# Patient Record
Sex: Male | Born: 1990 | Race: White | Hispanic: No | Marital: Single | Smoking: Current some day smoker
Health system: Southern US, Community
[De-identification: ages and names within clinical notes are randomized; demographics above are authoritative.]

## PROBLEM LIST (undated history)

## (undated) DIAGNOSIS — R1310 Dysphagia, unspecified: Secondary | ICD-10-CM

## (undated) DIAGNOSIS — S6990XA Unspecified injury of unspecified wrist, hand and finger(s), initial encounter: Secondary | ICD-10-CM

## (undated) DIAGNOSIS — M21379 Foot drop, unspecified foot: Secondary | ICD-10-CM

## (undated) DIAGNOSIS — R21 Rash and other nonspecific skin eruption: Secondary | ICD-10-CM

## (undated) DIAGNOSIS — R198 Other specified symptoms and signs involving the digestive system and abdomen: Secondary | ICD-10-CM

## (undated) HISTORY — PX: ORIF CLAVICLE FRACTURE: SUR924

---
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
## 2008-01-04 ENCOUNTER — Ambulatory Visit (HOSPITAL_BASED_OUTPATIENT_CLINIC_OR_DEPARTMENT_OTHER): Admission: RE | Admit: 2008-01-04 | Discharge: 2008-01-04 | Payer: Self-pay | Admitting: Orthopedic Surgery

## 2008-01-04 HISTORY — PX: PERCUTANEOUS PINNING METACARPAL FRACTURE: SUR206

## 2008-04-13 ENCOUNTER — Ambulatory Visit (HOSPITAL_BASED_OUTPATIENT_CLINIC_OR_DEPARTMENT_OTHER): Admission: RE | Admit: 2008-04-13 | Discharge: 2008-04-13 | Payer: Self-pay | Admitting: Orthopedic Surgery

## 2008-04-13 HISTORY — PX: HAND HARDWARE REMOVAL: SUR1125

## 2009-01-23 ENCOUNTER — Emergency Department (HOSPITAL_COMMUNITY): Admission: EM | Admit: 2009-01-23 | Discharge: 2009-01-24 | Payer: Self-pay | Admitting: Emergency Medicine

## 2011-01-14 NOTE — Op Note (Signed)
Louis [name], Louis [name]             ACCOUNT NO.:  [id number]   MEDICAL RECORD NO.:  [id number]          PATIENT TYPE:  AMB   LOCATION:  DSC                          FACILITY:  [facility]   PHYSICIAN:  Cindee Salt, M.D.       DATE OF BIRTH:  Oct 13, 1990   DATE OF PROCEDURE:  01/04/2008  DATE OF DISCHARGE:                               OPERATIVE REPORT   PREOPERATIVE DIAGNOSIS:  Fractured fifth metacarpal, right hand.   POSTOPERATIVE DIAGNOSIS:  Fractured fifth metacarpal, right hand.   OPERATION:  Closed IM rodding of fifth metacarpal, right hand.   SURGEON:  Cindee Salt, MD   ASSISTANT:  Carolyne Fiscal   ANESTHESIA:  General.   ANESTHESIOLOGIST:  Guadalupe Maple, MD   FIXATION DEVICE:  [employer] IM rod.   HISTORY:  The patient is an 20 year old male who broke up with his  girlfriend, punched a wall, suffering a fracture of his fifth  metacarpal.  His mid shaft grossly angulated with comminution.  He is  admitted for close rodding possible open reduction internal fixation,  fifth metacarpal right hand.  He is aware of risks and complications  including infection, recurrence, injury to arteries, nerves, tendons  incomplete relief of symptoms, dystrophy, possibility of loss of  fixation, and bending of the rod necessitating plate fixation.   In the preoperative area, the patient is seen.  The extremity is marked  by both the patient and surgeon.  Antibiotic given.   PROCEDURE:  The patient is brought to the operating room where general  anesthetic was carried out without difficulty under the guidance of Dr.  Noreene Larsson.  He was Prepped using DuraPrep, supine position, right arm free.  The image intensifier was brought into position.  The fracture was  manipulated reduced.  This was held so long as pressure was maintained.  With any reduction of pressure, the fracture medially re-displaced.  Comminution on the volar surface was noted.  A small incision was made  proximally after  localization with the image intensifier carried down  through subcutaneous tissue.  Dorsal sensory branch of the ulnar nerve  protected.  The dissection carried between the extensor digiti quinti,  extensor carpi ulnaris.  The trocar was used to make a hole in the  proximal cortex of the fifth metacarpal.  The IM rod was then inserted.  This was then slowly manipulated up the metacarpal under image  intensification crossing the fracture site maintaining a reduced  position with full flexion of all fingers and volar pressure to maintain  the distal fragment dorsally positioned.  The IM rod was then inserted  all the way up to the metacarpal head.  X-rays confirmed reduction of  the fracture in both AP lateral and oblique directions.  The pin was  then bent, cut short beneath the skin.  X-rays confirmed maintenance of  the position of the fracture fragments.  The skin was closed with  interrupted 5-0 Vicryl Rapide sutures.  Sterile compressive dressing and  ulnar gutter splint  was applied.  The patient tolerated the procedure well and was taken to  the recovery room  for observation in satisfactory condition.  He will be  discharged home, and to return to the [facility] in [place] in 1-  week time, on Vicodin.           ______________________________  Cindee Salt, M.D.     GK/MEDQ  D:  01/04/2008  T:  01/05/2008  Job:  [id number]

## 2011-01-14 NOTE — Op Note (Signed)
NAMEAMARRION, [name]             ACCOUNT NO.:  [id number]   MEDICAL RECORD NO.:  [id number]          PATIENT TYPE:  AMB   LOCATION:  DSC                          FACILITY:  [facility]   PHYSICIAN:  Cindee Salt, M.D.       DATE OF BIRTH:  1991/07/02   DATE OF PROCEDURE:  04/13/2008  DATE OF DISCHARGE:                               OPERATIVE REPORT   PREOPERATIVE DIAGNOSIS:  Status post IM rod fixation fifth metacarpal  fracture, right hand.   POSTOPERATIVE DIAGNOSIS:  Status post IM rod fixation fifth metacarpal  fracture, right hand.   OPERATION:  Removal IM rod fifth metacarpal right hand.   SURGEON:  Cindee Salt, MD   SUMMARY:  Louis Courts, RN   ANESTHESIA:  General.   ANESTHESIOLOGIST:  Maren Beach, MD   HISTORY:  The patient is an 20 year old male who suffered a fracture of  his midshaft fifth metacarpal.  He underwent IM rod fixation.  This has  gone on to union.  He is admitted now for removal of the IM rod and that  it is prominent proximally.  He is aware of risks and complications  including infection, recurrence, injury to arteries, nerves, or tendons,  incomplete relief of symptoms, or dystrophy.  In the preoperative area,  the patient is seen.  The extremity marked by both the patient and  surgeon.  Antibiotic given.   PROCEDURE:  The patient was brought to the operating room where a  general anesthetic was carried out under the direction of Dr. Katrinka Blazing.  He  was prepped using DuraPrep, supine position, right arm free.  The limb  was exsanguinated with an Esmarch bandage after a time-out.  The  tourniquet was placed high and the arm was inflated to 250 mmHg.  A  straight incision was made directly over the tip of the IM rod, carried  down through the subcutaneous tissue.  Neurovascular structures  protected.  The IM rod was immediately identified.  This was removed  without difficulty with a pair of pliers.  The wound was irrigated and  closed interrupted 5-0  Vicryl Rapide sutures.  The patient tolerated the  procedure well was taken to the recovery room for observation in  satisfactory condition.  He will be discharged home to return to [facility] [place] in 1 week on Vicodin.           ______________________________  Cindee Salt, M.D.     GK/MEDQ  D:  04/13/2008  T:  04/14/2008  Job:  [id number]

## 2011-01-14 NOTE — Consult Note (Signed)
NAMEZACKERY, BRINE NO.:  [id number]   MEDICAL RECORD NO.:  [id number]          PATIENT TYPE:  INP   LOCATION:  1508                         FACILITY:  [facility]   PHYSICIAN:  Anselm Jungling, MD  DATE OF BIRTH:  31-May-1991   DATE OF CONSULTATION:  11/23/2007  DATE OF DISCHARGE:                                 CONSULTATION   IDENTIFYING DATA AND REASON FOR REFERRAL:  The patient is an 20 year old  single white male currently under the care of the [facility] here  at [facility], in the aftermath of an overdose.  Psychiatric  consultation is requested to assess mental status and make  recommendations.   HISTORY OF THE PRESENTING PROBLEMS:  The patient is 34 years old, and  currently lives with his girlfriend locally.  His parents live in  [place].  He states that he recently tried to moved to  [place], [place], but had to move back, and is now living with his  girlfriend.  He had a severe argument with his girlfriend, which led him  to take an overdose of his antidepressant Lexapro on November 22, 2007.  The patient has subsequently been treated for that and is medically  cleared now.   The patient has a psychiatric history, having been hospitalized at our  inpatient adult psychiatric unit from November 4 to July 12, 2007  under the care of Dr. Milford Cage.  At that time, he was treated for  mood disorder with a combination of Lamictal, Risperdal, Abilify, and  referred to Dr. Tomasa Rand, who remains his outpatient psychiatrist.  He  was also referred to a therapist named Ulice Bold.   The patient at this time indicates that he still intends to follow up  with Dr. Tomasa Rand for ongoing psychiatric medication management.   The patient also has a history of polysubstance abuse, and current urine  drug screen is positive for marijuana and benzodiazepines.   MENTAL STATUS AND OBSERVATIONS:  The patient is a well-nourished,  well-  developed, young adult male who was pleasant and cooperative in the  interview situation.  He is polite, and shakes my hand.  He is fully  alert and oriented x4.  There is nothing to suggest any underlying  formal thought disorder, psychosis, or delusionality.  His mood appears  neutral, and his affect is appropriate.   With respect to the overdose, he states that he has never attempted  suicide before.  He indicates that he regrets his overdose and is glad  for his survival.  He denies any current thoughts, impulses, or plans to  harm himself.  He states that he and his girlfriend have made up, and in  fact, his girlfriend is present in the room, and he wished for her to be  at present while I interviewed him.   He states that his plan is to follow-up with Dr. Tomasa Rand, and return  to live with his girlfriend in her home.  The girlfriend appears to be  completely comfortable with this plan.   IMPRESSION:  The patient is an 20 year old male  with a history of mood  disorder and polysubstance abuse.  At this time, he does not appear to  be appropriate for inpatient psychiatric treatment.  He admits that his  overdose was impulsive and poorly judged, and he regrets it.  He  indicates that he will follow-up with his usual outpatient psychiatric  [facility].   DIAGNOSTIC IMPRESSION:  AXIS I:  Mood disorder, not otherwise specified,  and polysubstance abuse, not otherwise specified.  AXIS II:  Deferred.  AXIS III:  No acute or chronic illnesses.  AXIS IV:  Stressors severe.  AXIS V:  GAF 65.   RECOMMENDATIONS:  As above.   Thank you for involving me in this patient's care.      Anselm Jungling, MD  Electronically Signed     SPB/MEDQ  D:  11/23/2007  T:  11/23/2007  Job:  [id number]

## 2011-01-14 NOTE — Discharge Summary (Signed)
Louis [name], Louis [name]             ACCOUNT NO.:  [id number]   MEDICAL RECORD NO.:  [id number]          PATIENT TYPE:  IPS   LOCATION:  0604                          FACILITY:  [facility]   PHYSICIAN:  Jasmine Pang, M.D. DATE OF BIRTH:  Nov 14, 1990   DATE OF ADMISSION:  07/06/2007  DATE OF DISCHARGE:  07/12/2007                               DISCHARGE SUMMARY   IDENTIFYING INFORMATION:  An 20 year old single white male who was  admitted on the voluntary basis on July 06, 2007.   HISTORY OF PRESENT ILLNESS:  The patient complains of anxiety with  episodes of panic.  He feels overwhelmed with anxiety.  He has been  smoking marijuana several times a day.  He has also been drinking  alcohol to calm his nerves.  He has been abusing multiple substances.  One week ago, took alcohol with several Risperdal and Tylenol.  He wants  to get clean and move on with his life he states.  A prior OD was  intentional.  He endorses suicidal ideation.  He has had the  polysubstance abuse since December 2007.  The patient currently sees Dr.  Tomasa Rand at [place].  This is a first [facility] admission for the  patient.  This is a first inpatient admission.  He has had one suicide  attempt, no abuse, one prior history of cutting self several times, and  he has no medical problems.  He is currently on Abilify 15 mg at bedtime  and Lamictal 50 mg at bedtime.  He has akathisia with Seroquel.   PHYSICAL FINDINGS:  There were no acute physical or medical problems  noted on physical exam.   Urinalysis was within normal limits.  TSH was 2.659.  Comprehensive  metabolic panel was within normal limits.  CBC was within normal limits.  Urine drug screen was positive for marijuana.   HOSPITAL COURSE:  Upon admission, the patient was continued on Abilify  50 mg p.o. at bedtime, was also placed on Ambien 10 mg p.o. at bedtime,  was placed on Lamictal 50 mg p.o. at bedtime, and Benadryl 25 mg p.o.  t.i.d. p.r.n. anxiety.   On July 09, 2007, he was started on Risperdal  0.5 mg p.o. q. 6h. p.r.n. anxiety.  The patient tolerated these  medications well with no significant side effects.  He was friendly and  cooperative in individual sessions with me.  He was also able to  participate appropriately in unit therapeutic groups and activities.  He  discussed his really bad anxiety.  He had been using a lot of over-the-  counter medications like Robitussin and Sudafed.  He has been taking  Adderall that he buys off the street to help focus.  He admits to using  marijuana daily.  He endorses some conflict with his parents due to his  substance abuse.  As hospitalization progressed, the patient's mood  improved.  He at times had fleeting suicidal ideation, but no intent.  There was no homicidal ideation.  Sleep was good.  Appetite was good.  On July 09, 2007, he felt worse being depressed, anxious, and  irritable.  He stated I feel like I want to die but contracted for  safety here.  He is hoping that his girlfriend would visit.  He enjoyed  a visit from his mother and sister the day before.  On July 11, 2007,  mental status had improved.  Sleep was good.  Appetite was good.  His  mood was euthymic.  No suicidal ideation.  His family session was  scheduled for July 12, 2007, and he is going to be going home after  this.  His states his goal is to be closer to his family.  On July 12, 2007, mental status had improved markedly from admission status.  The patient continued to be euthymic.  Affect was wide range.  There was  no suicidal or homicidal ideation.  No thoughts of self-injurious  behavior.  No auditory or visual hallucinations.  No paranoia or  delusions.  Thoughts were logical and goal-directed.  Thought content,  no predominant theme.  Cognitive was grossly back to baseline.  It was  felt the patient was stable and safe to be discharged today.   DISCHARGE DIAGNOSES:  AXIS I:  Mood disorder,  not otherwise specified.  Anxiety disorder, not otherwise specified.  Polysubstance abuse.  AXIS II:  None.  AXIS III:  None.  AXIS IV:  Moderate (issues with unemployment, problems with primary  support group, economic problem, burden of psychiatric illness).  AXIS V:  Global assessment of functioning was 50 upon discharge, global  assessment of functioning was 38 upon admission, and global assessment  of functioning highest past year was 67.   DISCHARGE PLANS:  There were no specific activity level or dietary  restrictions.   POST HOSPITAL CARE PLANS:  The patient will see Dr. Tomasa Rand on  Friday, July 16, 2007, at 10:45.  He will also see Ulice Bold,  his therapist on Wednesday, July 21, 2007, at 10:30 a.m.   DISCHARGE MEDICATIONS:  1. Lamictal 50 mg at bedtime.  2. Ambien 10 mg at bedtime p.r.n.  3. Risperdal 0.5 mg q. 6h. p.r.n. anxiety.  4. Abilify 50 mg at bedtime.      Jasmine Pang, M.D.  Electronically Signed     BHS/MEDQ  D:  07/12/2007  T:  07/13/2007  Job:  [id number]

## 2011-01-14 NOTE — H&P (Signed)
NAMERECARDO, LINN             ACCOUNT NO.:  [id number]   MEDICAL RECORD NO.:  [id number]          PATIENT TYPE:  INP   LOCATION:  0110                         [facility]ACILITY:  [facility]   PHYSICIAN:  Isidor Holts, M.D.  DATE OF BIRTH:  03/06/1991   DATE OF ADMISSION:  11/22/2007  DATE OF DISCHARGE:                              HISTORY & PHYSICAL   PMD:  Unassigned.   PRIMARY PSYCHIATRIST:  Dr. Tomasa Rand.   CHIEF COMPLAINTS:  Lexapro overdose.   HISTORY OF PRESENT ILLNESS:  This is an 20 year old male, who provided  the history, supplemented by his girlfriend who has accompanied him to  the emergency department.  According to them, they had an argument in  a.m. of November 22, 2007 and then after this quarrel, between 8:30 and  9:00 a.m. the patient took six 10-mg tablets of Lexapro.  He states that  he had not seriously intended to harm himself. And he had reportedly  obtained these pills from a friend.  He denies abdominal pain, vomiting,  chest pain, palpitations or shortness of breath.  His girlfriend called  emergency medical services.   PAST MEDICAL HISTORY:  1. Mood disorder/anxiety disorder, status post voluntary admission to      [facility] July 06, 2007 to July 12, 2007.  2. Substance abuse, i.e. marijuana.  3. Smoking history.   MEDICATIONS:  Not currently on any medication.  However, a review of  electronic medical records indicate that the patient was on:   1. Lamictal 15 mg p.o. q.h.s.  2. Ambien 10 mg p.o. p.r.n. q.h.s.  3. Risperdal 0.5 mg p.o. p.r.n. q.6h.  4. Abilify 50 mg p.o. q.h.s.   The patient claims, however, that these medications were lost with his  luggage when he was flying back from a trip to [place] on October 16, 2007. He has not refilled his medications since.   ALLERGIES:  SEROQUEL.   REVIEW OF SYSTEMS:  As per HPI and chief complaint. Otherwise negative.   SOCIAL HISTORY:  The patient currently lives with his  girlfriend. He is  unemployed and is a smoker.  He smokes approximately 5-6 cigarettes per  day but now states that he only takes one cigarette on alternate days.  He admits to occasional marijuana use.  Denies drug or other substance  abuse. States that he drinks alcohol usually only on weekends.  According to the patient he previously resided in [place] but left  for [place] on September 07, 2007, he thought,  permanently. However, he had  to come back to [place] on October 16, 2007.  And as such has no  scheduled appointment with his psychiatrist.  He has not yet called to  make the appointment.   FAMILY HISTORY:  The patient has three sisters, all of whom are alive  and well.  His mother is age 64 years old, alive and well.  His father  is age 83 years, alive and well.   PHYSICAL EXAMINATION:  VITALS:  Temperature 98.4, pulse 92 per minute  regular, respiratory rate 18, BP 124/75 mmHg, pulse oximeter 98% on room  air.  GENERAL APPEARANCE:  The patient does not appear to be in obvious acute  distress at time of this evaluation. Alert, communicative, not short of  breath at rest.  HEENT: No clinical pallor or jaundice.  No conjunctival injection.  Throat is clear.  NECK: Supple.  JVP not seen.  No palpable lymphadenopathy.  No palpable  goiter.  No carotid bruits.  CHEST:  Clinically clear to auscultation.  No wheezes or crackles.  CARDIOVASCULAR:  Heart sounds are heard. Normal, regular, no murmurs.  ABDOMEN:  Flat, soft and nontender.  No palpable organomegaly.  No  palpable masses.  Normal bowel sounds.  EXTREMITIES:  Lower extremity examination, no pitting edema.  Palpable  peripheral pulses.  MUSCULOSKELETAL:  Unremarkable.  CENTRAL NERVOUS SYSTEM: No focal neurologic deficit on gross  examination.   INVESTIGATIONS:  Electrolytes sodium 139, potassium 4.2, chloride 104,  CO2 of 29, BUN 10, creatinine 0.93, glucose 100.  Alcohol level less  than 5.  His 12-lead EKG  dated November 01, 2007 shows sinus rhythm regular  77 per minute normal axis, no acute ischemic changes.   ASSESSMENT AND PLAN:  1. Lexapro overdose.  The patient took six 10-mg  Lexapro between 8:30      and 9:00 a.m. on November 05, 2007.  He has been asymptomatic since,      and has been administered one dose of activated charcoal in the      emergency department.  Emergency department  MD has discussed      already with [facility] and telemetric monitoring is      recommended in case of arrhythmias.  We shall admit therefore to      telemetry and watch for possible seizures administer intravenous      fluids.  Provide one-on-one sitter, until evaluated by      psychiatrist. For completeness, we will do 4-hour Tylenol and      salicylate levels.   1. History of substance abuse.  For completeness, will do urine drug      screen.   1. Anxiety/mood disorder.  This may be contributory to Lexapro      overdose.  Will of course, consult psychiatrist, when the patient      is medically stable.   Further management will depend on clinical course.      Isidor Holts, M.D.  Electronically Signed     CO/MEDQ  D:  11/22/2007  T:  11/22/2007  Job:  [id number]

## 2011-01-14 NOTE — Discharge Summary (Signed)
[name], Louis [name]             ACCOUNT NO.:  [id number]   MEDICAL RECORD NO.:  [id number]          PATIENT TYPE:  INP   LOCATION:  1508                         [facility]ACILITY:  [facility]   PHYSICIAN:  Isidor Holts, M.D.  DATE OF BIRTH:  09-05-1990   DATE OF ADMISSION:  11/22/2007  DATE OF DISCHARGE:  11/23/2007                               DISCHARGE SUMMARY   PRIMARY MEDICAL DOCTOR:  Gentry Fitz.   PRIMARY PSYCHIATRIST:  Dr. Tomasa Rand.   DISCHARGE DIAGNOSES:  1. Lexapro overdose.  2. History of mood disorder/anxiety disorder.  3. Substance abuse.   DISCHARGE MEDICATIONS:  None.   PROCEDURES:  None.   CONSULTATIONS:  Dr Geralyn Flash, Psychiatrist.   ADMISSION HISTORY:  As in H&P notes of November 22, 2007.  However, in  brief, this is an 19 year old male, with known history of mood disorder,  anxiety disorder, substance abuse, i.e. marijuana, smoking history.  Status post voluntary admission to [facility] July 06, 2007 to July 11, 2008, who presents following ingestion of 6 tablets of 10 mg Lexapro,  following quarrel with girlfriend at about 8:30 to 9:00 a.m. November 22, 2007.  The patient was brought to the emergency department.  He was  asymptomatic.  At the time of presentation, he was administered a single  dose of activated charcoal and referred to the medical service for  admission.   CLINICAL COURSE:  1. Drug overdose.  For details of presentation, refer to admission      history above.  The patient was monitored, telemetrically, during      the course of his hospitalization, and no arrhythmias were      recorded.  Seizure precautions were instituted; however, no seizure      episodes were recorded.  By a.m. of November 23, 2007, the patient was      completely asymptomatic. The serum salicylate level was less than      4, and Tylenol less than 10 at 4 hours.  Electrolytes were      unremarkable.   1. Substance abuse.  The patient admits to utilization of marijuana.    Urine drug screen was positive for tetrahydrocannabinol, and      benzodiazepines.  The patient has been counseled appropriately.   1. Smoking history.  Patient has been counseled with regards to this.   1. Psychiatric problems.  The patient does have a history of      anxiety/mood disorder and had previously been under the care of Dr.      Tomasa Rand psychiatrist, although because he transiently moved to      [place], he appears to have had no follow-up since January 2009.  He      has been encouraged to re-establish contact with his primary      psychiatrist, for routine follow-up.   DISPOSITION:  The patient was on November 23, 2007 completely asymptomatic,  devoid of arrhythmias, and considered stable from medical viewpoint, to  be discharge or otherwise as recommended by psychiatrist.   Elenor Quinones INSTRUCTIONS:  As recommended by psychiatrist.      Isidor Holts, M.D.  Electronically Signed  CO/MEDQ  D:  11/23/2007  T:  11/23/2007  Job:  [id number]

## 2011-04-10 ENCOUNTER — Ambulatory Visit (HOSPITAL_BASED_OUTPATIENT_CLINIC_OR_DEPARTMENT_OTHER)
Admission: RE | Admit: 2011-04-10 | Discharge: 2011-04-11 | Disposition: A | Discharge status: Home / Self Care | Payer: BC Managed Care – PPO | Source: Ambulatory Visit | Attending: Orthopedic Surgery | Admitting: Orthopedic Surgery

## 2011-04-10 DIAGNOSIS — S42023A Displaced fracture of shaft of unspecified clavicle, initial encounter for closed fracture: Secondary | ICD-10-CM | POA: Insufficient documentation

## 2011-04-10 DIAGNOSIS — Y929 Unspecified place or not applicable: Secondary | ICD-10-CM | POA: Insufficient documentation

## 2011-04-10 DIAGNOSIS — F319 Bipolar disorder, unspecified: Secondary | ICD-10-CM | POA: Insufficient documentation

## 2011-04-10 DIAGNOSIS — F411 Generalized anxiety disorder: Secondary | ICD-10-CM | POA: Insufficient documentation

## 2011-04-10 DIAGNOSIS — X58XXXA Exposure to other specified factors, initial encounter: Secondary | ICD-10-CM | POA: Insufficient documentation

## 2011-04-10 LAB — POCT HEMOGLOBIN-HEMACUE: Hemoglobin: 15.7 g/dL (ref 13.0–17.0)

## 2011-04-24 NOTE — Op Note (Signed)
  NAMEAGUSTUS, MANE NO.:  [id number]  MEDICAL RECORD NO.:  [id number]  LOCATION:                                 FACILITY:  PHYSICIAN:  Loreta Ave, M.D. DATE OF BIRTH:  22-Sep-1990  DATE OF PROCEDURE:  04/10/2011 DATE OF DISCHARGE:                              OPERATIVE REPORT   PREOPERATIVE DIAGNOSIS:  Markedly displaced segmental three-part left clavicle shaft fracture, closed.  POSTOPERATIVE DIAGNOSIS:  Markedly displaced segmental three-part left clavicle shaft fracture, closed.  PROCEDURE:  Open reduction and internal fixation of left clavicle fracture with an 8-hole anterior-based Acumed plate and screws.  SURGEON:  Loreta Ave, MD  ASSISTANT:  Genene Churn. Barry Dienes, [place], present throughout the entire case and necessary for timely completion of procedure.  ANESTHESIA:  General.  BLOOD LOSS:  Minimal.  SPECIMENS:  None.  CULTURES:  None.  COMPLICATIONS:  None.  DRESSING:  Soft compressive with sling.  PROCEDURE:  The patient was brought to the operating room and after adequate anesthesia had been obtained, he was placed in beach-chair position on the shoulder positioner, prepped and draped in the usual sterile fashion.  Incision along the clavicle.  Skin and subcutaneous tissue divided.  Deltopectoral fascia opened up over both sides. Subperiosteal exposure of the fracture.  The middle segment was flipped 90 degrees.  This was taken down and fracture mobilized for reduction. With a series of clamps and a prebent 8-hole anterior plate, I was able to achieve anatomic alignment.  The plate was held and clamped in place and 8 screws were placed through the plate from front to back.  All clamps were removed.  Excellent stability.  Anatomic alignment visually and confirmed fluoroscopically.  Wound copiously irrigated.  Deltopectoral fascia was closed with Vicryl and wound was closed with Vicryl and staples. Sterile compressive dressing was  applied.  Sling was applied. Anesthesia was reversed.  Brought to the recovery room.  Tolerated the surgery well.  No complications.     Loreta Ave, M.D.     DFM/MEDQ  D:  04/10/2011  T:  04/11/2011  Job:  [id number]  Electronically Signed by Mckinley Jewel M.D. on 04/24/2011 04:15:00 PM

## 2011-05-26 LAB — COMPREHENSIVE METABOLIC PANEL
ALT: 17
AST: 22
Albumin: 4.5
Alkaline Phosphatase: 46
BUN: 10
CO2: 29
Calcium: 9.8
Chloride: 104
Creatinine, Ser: 0.93
GFR calc Af Amer: >60
GFR calc non Af Amer: >60
Glucose, Bld: 100 — ABNORMAL HIGH
Potassium: 4.2
Sodium: 139
Total Bilirubin: 1.1
Total Protein: 7.6

## 2011-05-26 LAB — ETHANOL: Alcohol, Ethyl (B): <5

## 2011-05-26 LAB — RAPID URINE DRUG SCREEN, HOSP PERFORMED
Barbiturates: NOT DETECTED
Benzodiazepines: POSITIVE — AB
Cocaine: NOT DETECTED
Opiates: NOT DETECTED

## 2011-05-26 LAB — BASIC METABOLIC PANEL
CO2: 29
Calcium: 9.2
Chloride: 104
Creatinine, Ser: 0.99
GFR calc Af Amer: >60
Glucose, Bld: 95

## 2011-05-26 LAB — CBC
Hemoglobin: 14.4
MCHC: 34.5
MCV: 91.4
RBC: 4.58
RDW: 13.1

## 2011-05-26 LAB — ACETAMINOPHEN LEVEL: Acetaminophen (Tylenol), Serum: <10 — ABNORMAL LOW

## 2011-05-26 LAB — SALICYLATE LEVEL: Salicylate Lvl: <4

## 2011-05-30 LAB — POCT HEMOGLOBIN-HEMACUE: Hemoglobin: 15.4

## 2011-06-10 ENCOUNTER — Emergency Department (HOSPITAL_COMMUNITY): Payer: BC Managed Care – PPO

## 2011-06-10 ENCOUNTER — Emergency Department (HOSPITAL_COMMUNITY)
Admission: EM | Admit: 2011-06-10 | Discharge: 2011-06-11 | Disposition: A | Discharge status: Home / Self Care | Payer: BC Managed Care – PPO | Attending: Emergency Medicine | Admitting: Emergency Medicine

## 2011-06-10 DIAGNOSIS — S42023A Displaced fracture of shaft of unspecified clavicle, initial encounter for closed fracture: Secondary | ICD-10-CM | POA: Insufficient documentation

## 2011-06-10 DIAGNOSIS — F319 Bipolar disorder, unspecified: Secondary | ICD-10-CM | POA: Insufficient documentation

## 2011-06-10 DIAGNOSIS — M25519 Pain in unspecified shoulder: Secondary | ICD-10-CM | POA: Insufficient documentation

## 2011-06-10 DIAGNOSIS — S4980XA Other specified injuries of shoulder and upper arm, unspecified arm, initial encounter: Secondary | ICD-10-CM | POA: Insufficient documentation

## 2011-06-10 DIAGNOSIS — S46909A Unspecified injury of unspecified muscle, fascia and tendon at shoulder and upper arm level, unspecified arm, initial encounter: Secondary | ICD-10-CM | POA: Insufficient documentation

## 2011-06-10 DIAGNOSIS — F341 Dysthymic disorder: Secondary | ICD-10-CM | POA: Insufficient documentation

## 2011-06-10 LAB — DRUGS OF ABUSE SCREEN W/O ALC, ROUTINE URINE
Amphetamine Screen, Ur: NEGATIVE
Barbiturate Quant, Ur: NEGATIVE
Benzodiazepines.: NEGATIVE
Cocaine Metabolites: NEGATIVE
Phencyclidine (PCP): NEGATIVE

## 2011-06-10 LAB — COMPREHENSIVE METABOLIC PANEL
AST: 16
Albumin: 4.4
BUN: 11
Calcium: 9.9
Creatinine, Ser: 0.91
GFR calc Af Amer: >60
Total Protein: 7.7

## 2011-06-10 LAB — THC (MARIJUANA), URINE, CONFIRMATION: Marijuana, Ur-Confirmation: 60 ng/mL

## 2011-06-10 LAB — CBC
HCT: 44.1
MCV: 89.9
Platelets: 236
RDW: 13

## 2011-06-10 LAB — URINALYSIS, ROUTINE W REFLEX MICROSCOPIC
Glucose, UA: NEGATIVE
Leukocytes, UA: NEGATIVE
Protein, ur: 30 — AB
Specific Gravity, Urine: 1.038 — ABNORMAL HIGH
pH: 6

## 2011-06-10 LAB — URINE MICROSCOPIC-ADD ON

## 2011-07-03 ENCOUNTER — Ambulatory Visit (HOSPITAL_BASED_OUTPATIENT_CLINIC_OR_DEPARTMENT_OTHER)
Admission: RE | Admit: 2011-07-03 | Discharge: 2011-07-03 | Disposition: A | Discharge status: Home / Self Care | Payer: BC Managed Care – PPO | Source: Ambulatory Visit | Attending: Orthopedic Surgery | Admitting: Orthopedic Surgery

## 2011-07-03 DIAGNOSIS — Z01812 Encounter for preprocedural laboratory examination: Secondary | ICD-10-CM | POA: Insufficient documentation

## 2011-07-03 DIAGNOSIS — X58XXXA Exposure to other specified factors, initial encounter: Secondary | ICD-10-CM | POA: Insufficient documentation

## 2011-07-03 DIAGNOSIS — S42009A Fracture of unspecified part of unspecified clavicle, initial encounter for closed fracture: Secondary | ICD-10-CM | POA: Insufficient documentation

## 2011-07-03 DIAGNOSIS — R002 Palpitations: Secondary | ICD-10-CM | POA: Insufficient documentation

## 2011-07-03 DIAGNOSIS — F319 Bipolar disorder, unspecified: Secondary | ICD-10-CM | POA: Insufficient documentation

## 2011-07-03 DIAGNOSIS — G43909 Migraine, unspecified, not intractable, without status migrainosus: Secondary | ICD-10-CM | POA: Insufficient documentation

## 2011-07-03 LAB — POCT HEMOGLOBIN-HEMACUE: Hemoglobin: 14.9 g/dL (ref 13.0–17.0)

## 2011-07-07 NOTE — Op Note (Signed)
NAMEJAILON, Louis [name] NO.:  [id number]  MEDICAL RECORD NO.:  [id number]  LOCATION:  [facility]                         FACILITY:  [facility]  PHYSICIAN:  Loreta Ave, M.D. DATE OF BIRTH:  1991-08-11  DATE OF PROCEDURE:  07/03/2011 DATE OF DISCHARGE:  07/03/2011                              OPERATIVE REPORT   PREOPERATIVE DIAGNOSES:  Left clavicle previous open reduction and internal fixation of a four-part middle third fracture with an anterior Acumed plate, now with a new traumatic fracture involving more medial aspect of the shaft, but extending about half way through where the plate was placed.  POSTOPERATIVE DIAGNOSES:  Left clavicle previous open reduction and internal fixation of a four-part middle third fracture with an anterior Acumed plate, now with a new traumatic fracture involving more medial aspect of the shaft, but extending about half way through where the plate was placed.  PROCEDURE:  Left clavicle open reduction and internal fixation with an eight-hole Acumed plate and nonlocking screws.  Revision of anterior hardware with removal of these 4 medial screws and then replacement of those 2 re-seat and secure the anterior plate after reduction of the fracture.  SURGEON:  Loreta Ave, MD  ASSISTANT:  Genene Churn. Barry Dienes, PA present throughout the entire case and necessary for timely completion of procedure.  ANESTHESIA:  General.  ESTIMATED BLOOD LOSS:  Minimal.  SPECIMENS:  None.  CULTURES:  None.  COMPLICATION:  None.  DRESSINGS:  Soft compressive sling.  PROCEDURE:  The patient was brought to the operating room and after adequate anesthesia had been obtained, placed in a beach-chair position on the shoulder positioner, prepped and draped in usual sterile fashion. Fluoroscopic guidance was used throughout.  I used the previous incision over the clavicle extending medially.  Skin and subcutaneous tissue divided.  On the medial end of the  fracture where the spike from the medial fracture was very superior he orbits to the deltopectoral fascia. Subperiosteal exposure of the entire fracture to allow reduction.  I had to remove the medial 4 screws in the anterior plate, but it was left anchored laterally.  I could then reduce the new fracture holding in place the anatomic plate and I fixed with an 8 hole plate placed superiorly with the medial 3 screws medial to the other plate.  Once that was solidly fixed and reduced to an anatomic position, 4 screws were placed back through the anterior plate so that the plates were 90 degrees to each other.  At completion, nice solid and stable fixation of the entire clavicle.  Anatomic alignment confirmed visually as well as fluoroscopically.  Wound irrigated.  Deltopectoral fascia was closed over the top of the clavicle as much as possible.  This was very frayed medially.  Skin and subcutaneous tissue were closed.  Margins were injected with Marcaine.  Sterile compressive dressing applied.  Shoulder immobilizer applied.  Anesthesia reversed.  Brought to the recovery room.  Tolerated surgery well.  No complications.     Loreta Ave, M.D.     DFM/MEDQ  D:  07/07/2011  T:  07/07/2011  Job:  [id number]

## 2012-08-17 IMAGING — CR DG CLAVICLE*L*
2 series · 2 of 2 positions shown · non-contrast
Comparison: None

CLINICAL DATA: Fell.  Pain.

LEFT CLAVICLE - 2+ VIEWS

[w clavicle ap left]
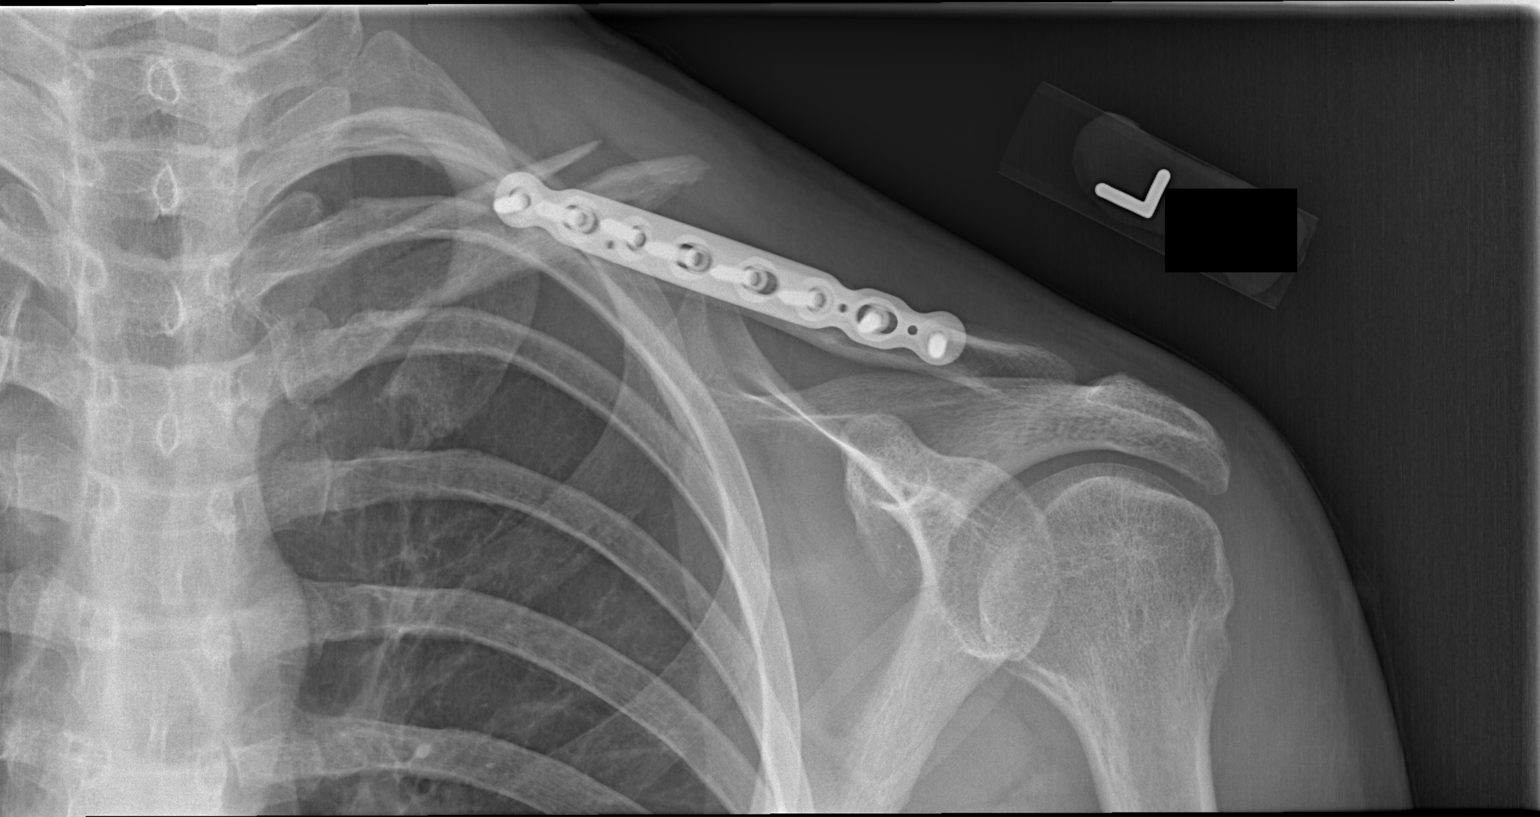

[w clavicle tangential left]
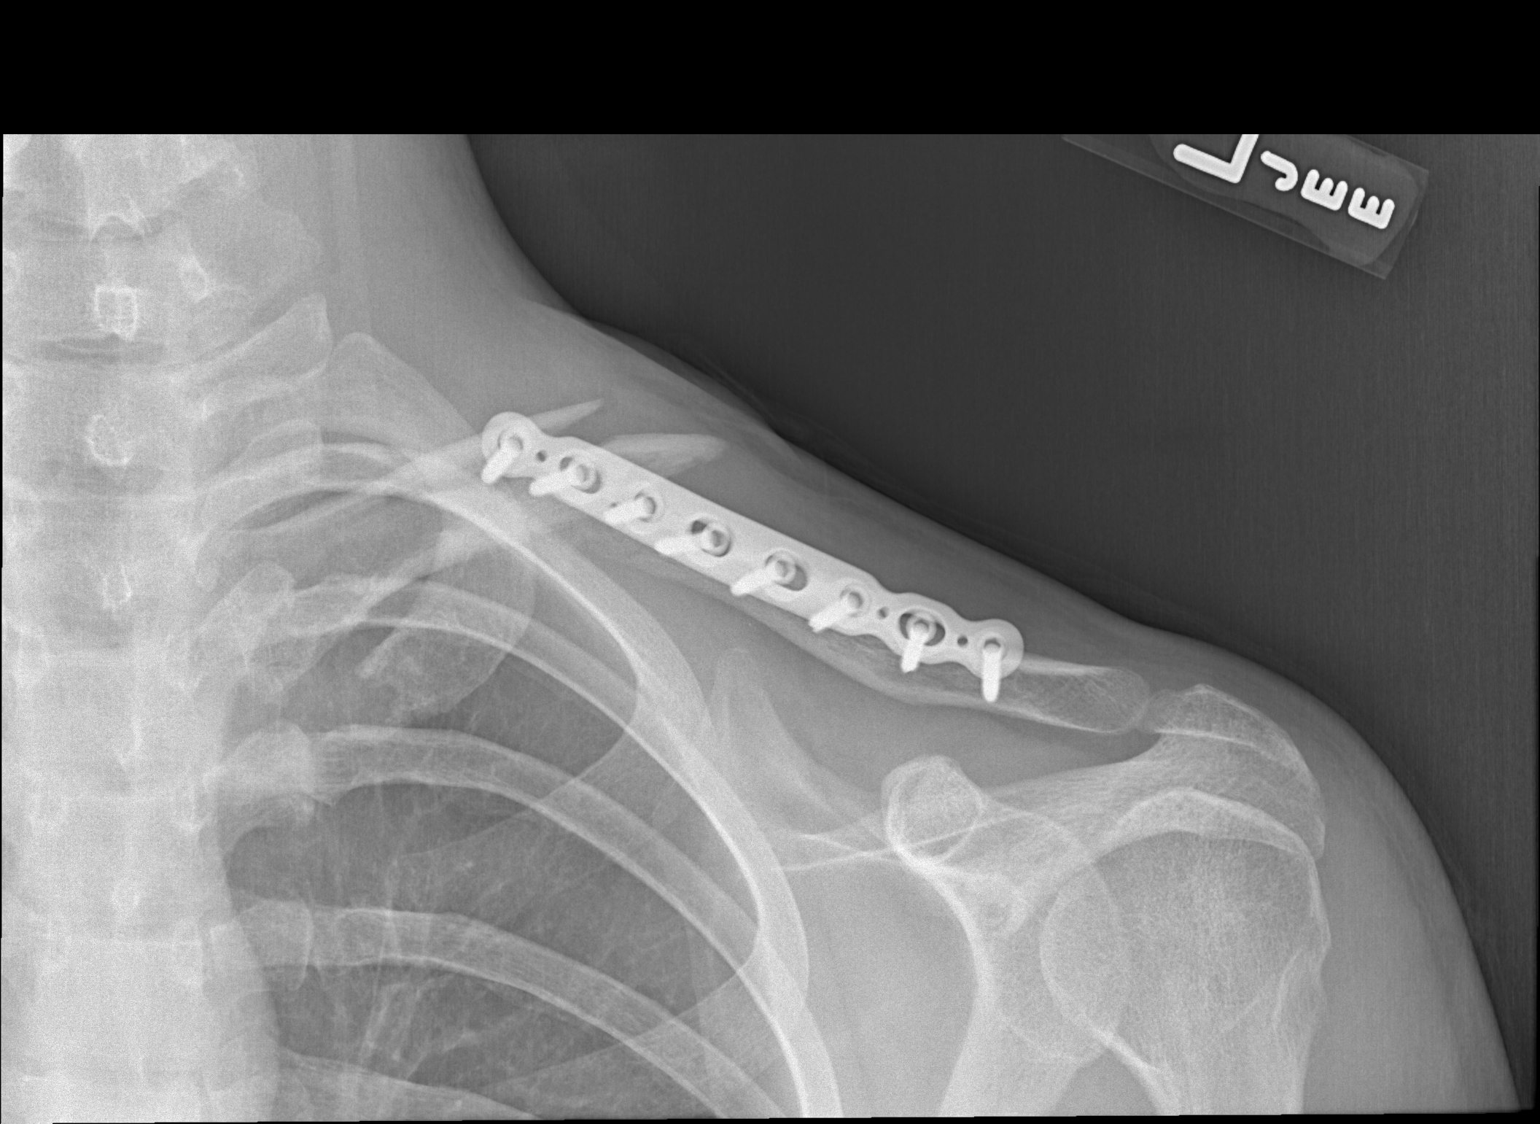

[2 of 2 positions shown; findings below may reference images not displayed]

FINDINGS: The patient has had previous ORIF of a fracture of the
mid clavicle with the plate and screws.  There is an acute fracture
medial to the plate with upward angulation.  No rib fracture or
shoulder region fracture.
IMPRESSION: Upward angulated fracture of the clavicle at the medial aspect of
the previous operative reduction and fixation.

## 2012-11-16 ENCOUNTER — Ambulatory Visit: Payer: Self-pay | Admitting: Physician Assistant

## 2013-08-04 ENCOUNTER — Encounter: Payer: Self-pay | Admitting: Family Medicine

## 2013-08-17 ENCOUNTER — Encounter: Payer: Self-pay | Admitting: Family Medicine

## 2013-08-17 ENCOUNTER — Encounter (INDEPENDENT_AMBULATORY_CARE_PROVIDER_SITE_OTHER): Payer: Self-pay

## 2013-08-17 ENCOUNTER — Ambulatory Visit (INDEPENDENT_AMBULATORY_CARE_PROVIDER_SITE_OTHER): Payer: PRIVATE HEALTH INSURANCE | Admitting: Family Medicine

## 2013-08-17 VITALS — BP 117/72 | HR 77 | Temp 98.0°F | Ht 70.0 in | Wt 176.0 lb

## 2013-08-17 DIAGNOSIS — Z0000 Encounter for general adult medical examination without abnormal findings: Secondary | ICD-10-CM

## 2013-08-17 DIAGNOSIS — M25561 Pain in right knee: Secondary | ICD-10-CM

## 2013-08-17 DIAGNOSIS — M21372 Foot drop, left foot: Secondary | ICD-10-CM

## 2013-08-17 DIAGNOSIS — J302 Other seasonal allergic rhinitis: Secondary | ICD-10-CM

## 2013-08-17 LAB — POCT CBC
Granulocyte percent: 53.1 %G (ref 37–80)
HCT, POC: 48.8 % (ref 43.5–53.7)
Hemoglobin: 16.2 g/dL (ref 14.1–18.1)
Lymph, poc: 2.5 (ref 0.6–3.4)
MCH, POC: 31.4 pg — AB (ref 27–31.2)
MCHC: 33.3 g/dL (ref 31.8–35.4)
MCV: 94.3 fL (ref 80–97)
MPV: 8.3 fL (ref 0–99.8)
POC Granulocyte: 3.1 (ref 2–6.9)
POC LYMPH PERCENT: 42.9 %L (ref 10–50)
Platelet Count, POC: 206 10*3/uL (ref 142–424)
RBC: 5.2 M/uL (ref 4.69–6.13)
RDW, POC: 12.3 %
WBC: 5.9 10*3/uL (ref 4.6–10.2)

## 2013-08-17 MED ORDER — NAPROXEN 500 MG PO TABS: 500.0000 mg | ORAL_TABLET | Freq: Two times a day (BID) | ORAL | Status: DC

## 2013-08-17 MED ORDER — FLUTICASONE PROPIONATE 50 MCG/ACT NA SUSP: 2.0000 | Freq: Every day | NASAL | Status: DC

## 2013-08-17 NOTE — Patient Instructions (Signed)
Testicular Self-Exam  A self-examination of your testicles involves looking at and feeling your testicles for abnormal lumps or swelling. Several things can cause swelling, lumps, or pain in your testicles. Some of these causes are:  · Injuries.  · Inflammation.  · Infection.  · Accumulation of fluids around your testicle (hydrocele).  · Twisted testicles (testicular torsion).  · Testicular cancer.  Self-examination of the testicles and groin areas may be advised if you are at risk for testicular cancer. Risks for testicular cancer include:  · An undescended testicle (cryptorchidism).  · A history of previous testicular cancer.  · A family history of testicular cancer.  The testicles are easiest to examine after warm baths or showers and are more difficult to examine when you are cold. This is because the muscles attached to the testicles retract and pull them up higher or into the abdomen.  Follow these steps while you are standing:  · Hold your penis away from your body.  · Roll one testicle between your thumb and forefinger, feeling the entire testicle.  · Roll the other testicle between your thumb and forefinger, feeling the entire testicle.  Feel for lumps, swelling, or discomfort. A normal testicle is egg shaped and feels firm. It is smooth and not tender. The spermatic cord can be felt as a firm spaghetti-like cord at the back of your testicle. It is also important to examine the crease between the front of your leg and your abdomen. Feel for any bumps that are tender. These could be enlarged lymph nodes.   Document Released: 11/24/2000 Document Revised: 04/20/2013 Document Reviewed: 02/07/2013  [employer]® Patient Information ©2014 [employer].

## 2013-08-17 NOTE — Progress Notes (Signed)
   Subjective:    Patient ID: Louis [name], male    DOB: [birth year], 22 y.o.   MRN: [id number]  HPI  This 22 y.o. male presents for evaluation of CPE.  He has c/o left foot drop.  He states he fractured His left foot a couple years ago and since the fracture he has not been able to raise his right foot up And has right foot drop.  He wears a boot to work and this helps.  He has difficulty with walking sometimes  He has right knee discomfort after he stands on his feet for 12 hours a day.  He  Otherwise has no complaints..  Review of Systems No chest pain, SOB, HA, dizziness, vision change, N/V, diarrhea, constipation, dysuria, urinary urgency or frequency, myalgias, arthralgias or rash.     Objective:   Physical Exam Vital signs noted  Well developed well nourished male.  HEENT - Head atraumatic Normocephalic                Eyes - PERRLA, Conjuctiva - clear Sclera- Clear EOMI                Ears - EAC's Wnl TM's Wnl Gross Hearing WNL                Nose - Nares patent                 Throat - oropharanx wnl Respiratory - Lungs CTA bilateral Cardiac - RRR S1 and S2 without murmur GI - Abdomen soft Nontender and bowel sounds active x 4 GU - Testes normal without masses, normal circumcised penis Extremities - No edema. Neuro - Grossly intact. MS - Left leg with atrophy and unable to raise left toe or dorsiflex.  Plantar Flexion intact.      Assessment & Plan:  Routine general medical examination at a health care facility - Plan: POCT CBC, CMP14+EGFR, Lipid panel, Thyroid Panel With TSH, Ambulatory referral to Orthopedic Surgery  Seasonal allergies - Plan: fluticasone (FLONASE) 50 MCG/ACT nasal spray, Ambulatory referral to Orthopedic Surgery  Left foot drop - Plan: Ambulatory referral to Orthopedic Surgery  Knee pain, right - Plan: naproxen (NAPROSYN) 500 MG tablet po bid x 10 days  Deatra Canter FNP

## 2013-08-18 LAB — CMP14+EGFR
ALT: 21 IU/L (ref 0–44)
AST: 24 IU/L (ref 0–40)
Albumin/Globulin Ratio: 2.1 (ref 1.1–2.5)
Albumin: 5.2 g/dL (ref 3.5–5.5)
Alkaline Phosphatase: 54 IU/L (ref 39–117)
BUN/Creatinine Ratio: 9 (ref 8–19)
BUN: 9 mg/dL (ref 6–20)
CO2: 26 mmol/L (ref 18–29)
Calcium: 9.8 mg/dL (ref 8.7–10.2)
Chloride: 100 mmol/L (ref 97–108)
Creatinine, Ser: 1.05 mg/dL (ref 0.76–1.27)
GFR calc Af Amer: 114 mL/min/{1.73_m2} (ref >59)
GFR calc non Af Amer: 99 mL/min/{1.73_m2} (ref >59)
Globulin, Total: 2.5 g/dL (ref 1.5–4.5)
Glucose: 87 mg/dL (ref 65–99)
Potassium: 4.5 mmol/L (ref 3.5–5.2)
Sodium: 141 mmol/L (ref 134–144)
Total Bilirubin: 0.7 mg/dL (ref 0.0–1.2)
Total Protein: 7.7 g/dL (ref 6.0–8.5)

## 2013-08-18 LAB — LIPID PANEL
Chol/HDL Ratio: 4.8 ratio units (ref 0.0–5.0)
Cholesterol, Total: 229 mg/dL — ABNORMAL HIGH (ref 100–189)
HDL: 48 mg/dL (ref >39)
LDL Calculated: 164 mg/dL — ABNORMAL HIGH (ref 0–119)
Triglycerides: 87 mg/dL (ref 0–114)
VLDL Cholesterol Cal: 17 mg/dL (ref 5–40)

## 2013-08-18 LAB — THYROID PANEL WITH TSH
Free Thyroxine Index: 2.2 (ref 1.2–4.9)
T3 Uptake Ratio: 30 % (ref 24–39)
T4, Total: 7.4 ug/dL (ref 4.5–12.0)
TSH: 0.854 u[IU]/mL (ref 0.450–4.500)

## 2014-03-16 ENCOUNTER — Encounter (HOSPITAL_BASED_OUTPATIENT_CLINIC_OR_DEPARTMENT_OTHER): Payer: Self-pay

## 2014-03-16 ENCOUNTER — Ambulatory Visit (HOSPITAL_BASED_OUTPATIENT_CLINIC_OR_DEPARTMENT_OTHER): Admit: 2014-03-16 | Payer: Self-pay | Admitting: Orthopedic Surgery

## 2014-03-16 SURGERY — RECESSION, MUSCLE, GASTROCNEMIUS
Anesthesia: General | Site: Foot | Laterality: Left

## 2014-03-22 ENCOUNTER — Encounter: Payer: Self-pay | Admitting: *Deleted

## 2017-11-22 DIAGNOSIS — S6990XA Unspecified injury of unspecified wrist, hand and finger(s), initial encounter: Secondary | ICD-10-CM

## 2017-11-22 HISTORY — DX: Unspecified injury of unspecified wrist, hand and finger(s), initial encounter: S69.90XA

## 2017-11-23 ENCOUNTER — Encounter (HOSPITAL_BASED_OUTPATIENT_CLINIC_OR_DEPARTMENT_OTHER): Payer: Self-pay | Admitting: *Deleted

## 2017-11-23 ENCOUNTER — Other Ambulatory Visit: Payer: Self-pay

## 2017-11-23 ENCOUNTER — Other Ambulatory Visit: Payer: Self-pay | Admitting: Orthopedic Surgery

## 2017-11-23 DIAGNOSIS — R21 Rash and other nonspecific skin eruption: Secondary | ICD-10-CM

## 2017-11-23 HISTORY — DX: Rash and other nonspecific skin eruption: R21

## 2017-11-24 ENCOUNTER — Ambulatory Visit (HOSPITAL_BASED_OUTPATIENT_CLINIC_OR_DEPARTMENT_OTHER): Admit: 2017-11-24 | Payer: Self-pay | Admitting: Orthopedic Surgery

## 2017-11-24 ENCOUNTER — Encounter (HOSPITAL_BASED_OUTPATIENT_CLINIC_OR_DEPARTMENT_OTHER)
Admission: RE | Disposition: A | Discharge status: Home / Self Care | Payer: Self-pay | Source: Ambulatory Visit | Attending: Orthopedic Surgery

## 2017-11-24 ENCOUNTER — Ambulatory Visit (HOSPITAL_BASED_OUTPATIENT_CLINIC_OR_DEPARTMENT_OTHER): Payer: BLUE CROSS/BLUE SHIELD | Admitting: Anesthesiology

## 2017-11-24 ENCOUNTER — Other Ambulatory Visit: Payer: Self-pay

## 2017-11-24 ENCOUNTER — Encounter (HOSPITAL_BASED_OUTPATIENT_CLINIC_OR_DEPARTMENT_OTHER): Payer: Self-pay | Admitting: Certified Registered"

## 2017-11-24 ENCOUNTER — Ambulatory Visit (HOSPITAL_BASED_OUTPATIENT_CLINIC_OR_DEPARTMENT_OTHER)
Admission: RE | Admit: 2017-11-24 | Discharge: 2017-11-24 | Disposition: A | Discharge status: Home / Self Care | Payer: BLUE CROSS/BLUE SHIELD | Source: Ambulatory Visit | Attending: Orthopedic Surgery | Admitting: Orthopedic Surgery

## 2017-11-24 ENCOUNTER — Encounter (HOSPITAL_BASED_OUTPATIENT_CLINIC_OR_DEPARTMENT_OTHER): Payer: Self-pay

## 2017-11-24 DIAGNOSIS — S67192A Crushing injury of right middle finger, initial encounter: Secondary | ICD-10-CM | POA: Insufficient documentation

## 2017-11-24 DIAGNOSIS — Y990 Civilian activity done for income or pay: Secondary | ICD-10-CM | POA: Insufficient documentation

## 2017-11-24 DIAGNOSIS — W230XXA Caught, crushed, jammed, or pinched between moving objects, initial encounter: Secondary | ICD-10-CM | POA: Diagnosis not present

## 2017-11-24 DIAGNOSIS — S62660A Nondisplaced fracture of distal phalanx of right index finger, initial encounter for closed fracture: Secondary | ICD-10-CM | POA: Diagnosis not present

## 2017-11-24 DIAGNOSIS — S67190A Crushing injury of right index finger, initial encounter: Secondary | ICD-10-CM | POA: Insufficient documentation

## 2017-11-24 DIAGNOSIS — F17200 Nicotine dependence, unspecified, uncomplicated: Secondary | ICD-10-CM | POA: Insufficient documentation

## 2017-11-24 HISTORY — DX: Dysphagia, unspecified: R13.10

## 2017-11-24 HISTORY — PX: ROTATION FLAP: SHX6211

## 2017-11-24 HISTORY — DX: Rash and other nonspecific skin eruption: R21

## 2017-11-24 HISTORY — DX: Unspecified injury of unspecified wrist, hand and finger(s), initial encounter: S69.90XA

## 2017-11-24 HISTORY — DX: Other specified symptoms and signs involving the digestive system and abdomen: R19.8

## 2017-11-24 HISTORY — DX: Foot drop, unspecified foot: M21.379

## 2017-11-24 SURGERY — IRRIGATION AND DEBRIDEMENT EXTREMITY
Anesthesia: Regional | Laterality: Right

## 2017-11-24 SURGERY — CREATION, FLAP, ROTATION
Anesthesia: General | Site: Hand | Laterality: Right

## 2017-11-24 MED ORDER — LACTATED RINGERS IV SOLN
INTRAVENOUS | Status: DC
  Administered 2017-11-24 (×2): via INTRAVENOUS

## 2017-11-24 MED ORDER — PROPOFOL 10 MG/ML IV BOLUS
INTRAVENOUS | Status: DC | PRN
  Administered 2017-11-24: 200 mg via INTRAVENOUS

## 2017-11-24 MED ORDER — SCOPOLAMINE 1 MG/3DAYS TD PT72: 1.0000 | MEDICATED_PATCH | Freq: Once | TRANSDERMAL | Status: DC | PRN

## 2017-11-24 MED ORDER — ONDANSETRON HCL 4 MG/2ML IJ SOLN
INTRAMUSCULAR | Status: AC
  Filled 2017-11-24: qty 12

## 2017-11-24 MED ORDER — FENTANYL CITRATE (PF) 100 MCG/2ML IJ SOLN
INTRAMUSCULAR | Status: AC
  Filled 2017-11-24: qty 2

## 2017-11-24 MED ORDER — PROPOFOL 500 MG/50ML IV EMUL
INTRAVENOUS | Status: AC
  Filled 2017-11-24: qty 50

## 2017-11-24 MED ORDER — OXYCODONE-ACETAMINOPHEN 5-325 MG PO TABS: 1.0000 | ORAL_TABLET | ORAL | 0 refills | Status: AC | PRN

## 2017-11-24 MED ORDER — DEXAMETHASONE SODIUM PHOSPHATE 10 MG/ML IJ SOLN
INTRAMUSCULAR | Status: DC | PRN
  Administered 2017-11-24: 10 mg via INTRAVENOUS

## 2017-11-24 MED ORDER — CEFAZOLIN SODIUM-DEXTROSE 2-4 GM/100ML-% IV SOLN
INTRAVENOUS | Status: AC
Start: 2017-11-24 — End: ?
  Filled 2017-11-24: qty 100

## 2017-11-24 MED ORDER — LIDOCAINE HCL (CARDIAC) 20 MG/ML IV SOLN
INTRAVENOUS | Status: AC
  Filled 2017-11-24: qty 30

## 2017-11-24 MED ORDER — ONDANSETRON HCL 4 MG/2ML IJ SOLN
INTRAMUSCULAR | Status: DC | PRN
  Administered 2017-11-24: 4 mg via INTRAVENOUS

## 2017-11-24 MED ORDER — MIDAZOLAM HCL 2 MG/2ML IJ SOLN
1.0000 mg | INTRAMUSCULAR | Status: DC | PRN
  Administered 2017-11-24 (×2): 2 mg via INTRAVENOUS

## 2017-11-24 MED ORDER — MIDAZOLAM HCL 2 MG/2ML IJ SOLN
INTRAMUSCULAR | Status: AC
  Filled 2017-11-24: qty 2

## 2017-11-24 MED ORDER — FENTANYL CITRATE (PF) 100 MCG/2ML IJ SOLN
50.0000 ug | INTRAMUSCULAR | Status: DC | PRN
  Administered 2017-11-24: 100 ug via INTRAVENOUS

## 2017-11-24 MED ORDER — ROPIVACAINE HCL 7.5 MG/ML IJ SOLN
INTRAMUSCULAR | Status: DC | PRN
  Administered 2017-11-24: 20 mL via PERINEURAL

## 2017-11-24 MED ORDER — CEFAZOLIN SODIUM-DEXTROSE 2-4 GM/100ML-% IV SOLN
2.0000 g | INTRAVENOUS | Status: AC
  Administered 2017-11-24: 2 g via INTRAVENOUS

## 2017-11-24 MED ORDER — DEXAMETHASONE SODIUM PHOSPHATE 10 MG/ML IJ SOLN
INTRAMUSCULAR | Status: AC
  Filled 2017-11-24: qty 2

## 2017-11-24 MED ORDER — CHLORHEXIDINE GLUCONATE 4 % EX LIQD: 60.0000 mL | Freq: Once | CUTANEOUS | Status: DC

## 2017-11-24 SURGICAL SUPPLY — 37 items
BLADE SURG 15 STRL LF DISP TIS (BLADE) ×1 IMPLANT
BLADE SURG 15 STRL SS (BLADE) ×3
BNDG CMPR 9X4 STRL LF SNTH (GAUZE/BANDAGES/DRESSINGS)
BNDG COHESIVE 3X5 TAN STRL LF (GAUZE/BANDAGES/DRESSINGS) IMPLANT
BNDG ESMARK 4X9 LF (GAUZE/BANDAGES/DRESSINGS) IMPLANT
BNDG GAUZE ELAST 4 BULKY (GAUZE/BANDAGES/DRESSINGS) IMPLANT
CHLORAPREP W/TINT 26ML (MISCELLANEOUS) ×3 IMPLANT
CORD BIPOLAR FORCEPS 12FT (ELECTRODE) ×3 IMPLANT
COVER BACK TABLE 60X90IN (DRAPES) ×3 IMPLANT
COVER MAYO STAND STRL (DRAPES) ×3 IMPLANT
CUFF TOURNIQUET SINGLE 18IN (TOURNIQUET CUFF) ×3 IMPLANT
DRAPE EXTREMITY T 121X128X90 (DRAPE) ×3 IMPLANT
DRAPE SURG 17X23 STRL (DRAPES) ×3 IMPLANT
GAUZE SPONGE 4X4 12PLY STRL (GAUZE/BANDAGES/DRESSINGS) ×3 IMPLANT
GAUZE XEROFORM 1X8 LF (GAUZE/BANDAGES/DRESSINGS) ×3 IMPLANT
GLOVE BIOGEL PI IND STRL 8.5 (GLOVE) ×1 IMPLANT
GLOVE BIOGEL PI INDICATOR 8.5 (GLOVE) ×2
GLOVE SURG ORTHO 8.0 STRL STRW (GLOVE) ×3 IMPLANT
GOWN STRL REUS W/ TWL LRG LVL3 (GOWN DISPOSABLE) ×1 IMPLANT
GOWN STRL REUS W/TWL LRG LVL3 (GOWN DISPOSABLE) ×3
GOWN STRL REUS W/TWL XL LVL3 (GOWN DISPOSABLE) ×3 IMPLANT
NDL PRECISIONGLIDE 27X1.5 (NEEDLE) IMPLANT
NEEDLE PRECISIONGLIDE 27X1.5 (NEEDLE) IMPLANT
NS IRRIG 1000ML POUR BTL (IV SOLUTION) ×3 IMPLANT
PACK BASIN DAY SURGERY FS (CUSTOM PROCEDURE TRAY) ×3 IMPLANT
PAD CAST 3X4 CTTN HI CHSV (CAST SUPPLIES) IMPLANT
PADDING CAST ABS 4INX4YD NS (CAST SUPPLIES) ×2
PADDING CAST ABS COTTON 4X4 ST (CAST SUPPLIES) ×1 IMPLANT
PADDING CAST COTTON 3X4 STRL (CAST SUPPLIES)
SPLINT FINGER 4.25 BULB 911906 (SOFTGOODS) ×4 IMPLANT
STOCKINETTE 4X48 STRL (DRAPES) ×3 IMPLANT
SUT ETHILON 4 0 PS 2 18 (SUTURE) ×3 IMPLANT
SUT VICRYL 4-0 PS2 18IN ABS (SUTURE) IMPLANT
SYR BULB 3OZ (MISCELLANEOUS) ×3 IMPLANT
SYR CONTROL 10ML LL (SYRINGE) IMPLANT
TOWEL OR 17X24 6PK STRL BLUE (TOWEL DISPOSABLE) ×3 IMPLANT
UNDERPAD 30X30 (UNDERPADS AND DIAPERS) ×3 IMPLANT

## 2017-11-24 NOTE — Progress Notes (Signed)
Assisted Dr. Turk with right, ultrasound guided, supraclavicular block. Side rails up, monitors on throughout procedure. See vital signs in flow sheet. Tolerated Procedure well. 

## 2017-11-24 NOTE — Anesthesia Postprocedure Evaluation (Signed)
Anesthesia Post Note  Patient: Hipolito BayleyDaniel Ray Humphrey  Procedure(s) Performed: DEBRIDEMENT POSSIBLE NAIL ABLATION,POSSIBLE GRAFT,POSSIBLE CROSS FINGER FLAPS, RIGHT INDEX AND MIDDLE FINGERS (Right Hand)     Patient location during evaluation: PACU Anesthesia Type: General Level of consciousness: awake and alert, oriented, patient cooperative and awake Pain management: pain level controlled Vital Signs Assessment: post-procedure vital signs reviewed and stable Respiratory status: spontaneous breathing, nonlabored ventilation, respiratory function stable and patient connected to nasal cannula oxygen Cardiovascular status: blood pressure returned to baseline and stable Postop Assessment: no apparent nausea or vomiting Anesthetic complications: no    Last Vitals:  Vitals:   11/24/17 1230 11/24/17 1315  BP: (!) 131/96 127/87  Pulse: 91 76  Resp: 17 20  Temp:  36.4 C  SpO2: 98% 95%    Last Pain:  Vitals:   11/24/17 1315  TempSrc:   PainSc: 0-No pain                 Cecile HearingStephen Edward Keen Ewalt

## 2017-11-24 NOTE — Anesthesia Procedure Notes (Signed)
Procedure Name: LMA Insertion Performed by: Estefany Goebel, Jewel Baizeimothy D, CRNA Pre-anesthesia Checklist: Patient identified, Emergency Drugs available, Suction available, Patient being monitored and Timeout performed Patient Re-evaluated:Patient Re-evaluated prior to induction Oxygen Delivery Method: Circle system utilized Preoxygenation: Pre-oxygenation with 100% oxygen Induction Type: IV induction Ventilation: Mask ventilation without difficulty LMA: LMA inserted LMA Size: 4.0 Tube type: Oral Placement Confirmation: ETT inserted through vocal cords under direct vision,  positive ETCO2,  CO2 detector and breath sounds checked- equal and bilateral Tube secured with: Tape Dental Injury: Teeth and Oropharynx as per pre-operative assessment

## 2017-11-24 NOTE — Transfer of Care (Signed)
Immediate Anesthesia Transfer of Care Note  Patient: Louis [name]  Procedure(s) Performed: DEBRIDEMENT POSSIBLE NAIL ABLATION,POSSIBLE GRAFT,POSSIBLE CROSS FINGER FLAPS, RIGHT INDEX AND MIDDLE FINGERS (Right Hand)  Patient Location: PACU  Anesthesia Type:GA combined with regional for post-op pain  Level of Consciousness: awake and patient cooperative  Airway & Oxygen Therapy: Patient Spontanous Breathing and Patient connected to face mask oxygen  Post-op Assessment: Report given to RN and Post -op Vital signs reviewed and stable  Post vital signs: Reviewed and stable  Last Vitals:  Vitals Value Taken Time  BP 89/45 11/24/2017 11:53 AM  Temp    Pulse 72 11/24/2017 11:55 AM  Resp 16 11/24/2017 11:55 AM  SpO2 100 % 11/24/2017 11:55 AM  Vitals shown include unvalidated device data.  Last Pain:  Vitals:   11/24/17 1032  TempSrc: Oral  PainSc: 4       Patients Stated Pain Goal: 2 (11/24/17 1032)  Complications: No apparent anesthesia complications

## 2017-11-24 NOTE — Op Note (Signed)
NAMBarbaraann [name]:  Louis [name], Louis [name]             ACCOUNT NO.:  [id number]  MEDICAL RECORD NO.:  [id number]  LOCATION:                                 FACILITY:  PHYSICIAN:  Cindee SaltGary Adel Neyer, M.D.            DATE OF BIRTH:  DATE OF PROCEDURE:  11/24/2017 DATE OF DISCHARGE:                              OPERATIVE REPORT   PREOPERATIVE DIAGNOSES: 1. Karting injury with nail bed injuries. 2. Fractured distal phalanx, index finger. 3. Nail bed injury. middle finger, right hand.  POSTOPERATIVE DIAGNOSES: 1. Karting injury with nail bed injuries. 2. Fractured distal phalanx, index finger. 3. Nail bed injury. middle finger, right hand.  OPERATION: 1. Debridement of necrotic tissue with laceration repairs to the     middle finger. 2. Split-thickness nail bed graft from middle to index finger, right     hand.  SURGEON:  Cindee SaltGary Elivia Robotham, MD.  ASSISTANT:  None.  ANESTHESIA:  Supraclavicular block with IV sedation.  [facility]  ANESTHESIOLOGIST:  Dr. Desmond Lopeurk.  HISTORY:  The patient is a 27 year old male who sustained a karting injury to the distal phalanges of his index, middle finger, right hand. He was originally seen in an outlying hospital emergency room, now transferred for definitive care.  He is admitted for exploration, debridement, and repair for possible cross-finger flaps depending on condition of tissues.  In the preoperative area, the patient is seen, the extremity marked by both patient and surgeon, and antibiotic given.  DESCRIPTION OF PROCEDURE:  The patient was brought to the operating room after a supraclavicular block was carried out without difficulty in the preoperative area.  He was prepped using Betadine scrub and solution with the right arm free after removal of his dry dressings.  The limb was exsanguinated with an Esmarch bandage.  A time-out taken, confirmed the patient and procedure.  The limb was exsanguinated, and tourniquet on the upper arm  inflated to 250 mmHg.  The wounds were inspected.  The typical karting type injury with a longitudinal lacerations over the entire distal aspect of the index middle fingers was noted.  He had a small area of loss of tissue on the middle finger at the distal margin down to the bone.  On his index finger, he had injury into the bone with a loss of tissue at the distal margin.  The entire dorsal nail fold was entirely gone on the index finger.  The dorsal nail fold and proximal nail were present on his middle finger.  The 2 wounds were sharply debrided with a Beaver blade as necessary removing dead tissue and fragments of nail plate.  The wound was copiously irrigated with saline. The nail bed was undermined on the middle finger distally which allowed the wound to be closed with a horizontal mattress 6-0 chromic suture. The intact portion of the nail plate on the ulnar aspect of the middle finger was removed leaving the proximal aspect present.  This allowed a split-thickness nail bed graft to be harvested from the intact portion of the nail matrix allowing this to be used as a graft to the index finger to be able to  cover the bone.  This measured approximately 5 mm x 6 to 7 mm in distance.  The bone was scraped out to debride this.  This was done with a curette and the area was again irrigated.  The split- thickness nail graft was then sutured into position with interrupted 6-0 chromic sutures.  There was no way to reconstruct the dorsal nail fold on his index in that it was entirely gone.  The wound was then covered with nonadherent gauze, forming a bolster on the index finger to maintain the graft in position.  A sterile compressive dressing and splint were then applied to each digit.  The tourniquet was deflated, remaining fingers pinked.  He was taken to the recovery room for observation in satisfactory condition.  He will be discharged to home to return to the [facility] of [place]  in 1 week.  He is given a refill of Percocet.  He is on an antibiotic.          ______________________________ Cindee Salt, M.D.     GK/MEDQ  D:  11/24/2017  T:  11/24/2017  Job:  [id number]

## 2017-11-24 NOTE — Op Note (Signed)
Other Dictation: Dictation Number [id number]

## 2017-11-24 NOTE — Anesthesia Preprocedure Evaluation (Addendum)
Anesthesia Evaluation  Patient identified by MRN, date of birth, ID band Patient awake    Reviewed: Allergy & Precautions, NPO status , Patient's Chart, lab work & pertinent test results  Airway Mallampati: II  TM Distance: >3 FB Neck ROM: Full    Dental  (+) Teeth Intact, Dental Advisory Given   Pulmonary Current Smoker,    Pulmonary exam normal breath sounds clear to auscultation       Cardiovascular Exercise Tolerance: Good negative cardio ROS Normal cardiovascular exam Rhythm:Regular Rate:Normal     Neuro/Psych negative neurological ROS  negative psych ROS   GI/Hepatic negative GI ROS, Neg liver ROS,   Endo/Other  negative endocrine ROS  Renal/GU negative Renal ROS     Musculoskeletal FINGER TIP INJURY RIGHT INDEX AND RING MIDDLE FINGERS   Abdominal   Peds  Hematology negative hematology ROS (+)   Anesthesia Other Findings Day of surgery medications reviewed with the patient.  Reproductive/Obstetrics                            Anesthesia Physical Anesthesia Plan  ASA: II  Anesthesia Plan: Regional and General   Post-op Pain Management:  Regional for Post-op pain   Induction: Intravenous  PONV Risk Score and Plan: 1 and Midazolam, Ondansetron and Dexamethasone  Airway Management Planned: LMA  Additional Equipment:   Intra-op Plan:   Post-operative Plan: Extubation in OR  Informed Consent: I have reviewed the patients History and Physical, chart, labs and discussed the procedure including the risks, benefits and alternatives for the proposed anesthesia with the patient or authorized representative who has indicated his/her understanding and acceptance.   Dental advisory given  Plan Discussed with: CRNA  Anesthesia Plan Comments:        Anesthesia Quick Evaluation

## 2017-11-24 NOTE — H&P (Signed)
Louis [name] is an 27 y.o. male.   Chief Complaint: carding injury right hand HPI: Louis [name] is a 27 year old right-hand-dominant male who sustained an injury to his right index middle finger and a cardio machine at work. Occurred on 11/22/2017. [facility] [place]. The wounds were dressed. He was told that no sutures were needed. That his nail should regrow. He was referred for continued care. He has no prior history of injuries. He was placed on Keflex and hydrocodone. The injuries occurred to the dorsum of his index middle finger. Has no history of diabetes thyroid problems arthritis gout family history is negative for each of these also.      Past Medical History:  Diagnosis Date  . Difficulty swallowing pills   . Dropfoot    left  . Finger injury 11/22/2017   right index and middle  . Rash 11/23/2017   bilateral foot    Past Surgical History:  Procedure Laterality Date  . HAND HARDWARE REMOVAL Right 04/13/2008  . ORIF CLAVICLE FRACTURE Left 04/10/2011; 07/03/2011  . PERCUTANEOUS PINNING METACARPAL FRACTURE Right 01/04/2008   IM rod right 5th metacarpal    Family History  Problem Relation Age of Onset  . Depression Mother   . ADD / ADHD Mother   . Bipolar disorder Mother   . Heart Problems Maternal Grandfather 54       Coronary Bypass Surgery  . Bipolar disorder Sister    Social History:  reports that he has been smoking.  He has smoked for the past 10.00 years. He has never used smokeless tobacco. He reports that he drinks alcohol. He reports that he does not use drugs.  Allergies:  Allergies  Allergen Reactions  . Seroquel [Quetiapine Fumarate] Hives    No medications prior to admission.    No results found for this or any previous visit (from the past 48 hour(s)).  No results found.   Pertinent items are noted in HPI.  Height 5\' 9"  (1.753 m), weight 74.8 kg (165 lb).  General appearance: alert, cooperative and  appears stated age Head: Normocephalic, without obvious abnormality Neck: no JVD Resp: clear to auscultation bilaterally Cardio: regular rate and rhythm, S1, S2 normal, no murmur, click, rub or gallop GI: soft, non-tender; bowel sounds normal; no masses,  no organomegaly Extremities: nailbed injuries index and middle fingers right hand Pulses: 2+ and symmetric Skin: Skin color, texture, turgor normal. No rashes or lesions Neurologic: Grossly normal Incision/Wound: open  Assessment/Plan Assessment:  1. Crushing injury of right index finger, initial encounter ORTHO XR FINGER 2 VW UNILATERAL Right (index/long); AP, Lateral  2. Crushing injury of right middle finger, initial encounter ORTHO XR FINGER 2 VW UNILATERAL Right (index/long); AP, Lateral    Plan: I would recommend that we examine this under anesthesia. We will schedule him for examination debridement tomorrow as an outpatient with the possibility of ablation of the nail repair of nail matrix possible nailbed graft possible cross finger flaps with the donor site from the upper inner arm. Will be dictated by findings at the time of exploration.      Vinay Ertl R 11/24/2017, 9:51 AM

## 2017-11-24 NOTE — Brief Op Note (Signed)
11/24/2017  11:51 AM  PATIENT:  Louis [name]  27 y.o. male  PRE-OPERATIVE DIAGNOSIS:  FINGER TIP INJURY RIGHT INDEX AND RING MIDDLE FINGERS  POST-OPERATIVE DIAGNOSIS:  FINGER TIP INJURY RIGHT INDEX AND RING MIDDLE FINGERS  PROCEDURE:  Procedure(s) with comments: DEBRIDEMENT POSSIBLE NAIL ABLATION,POSSIBLE GRAFT,POSSIBLE CROSS FINGER FLAPS, RIGHT INDEX AND MIDDLE FINGERS (Right) - axillary block  SURGEON:  Surgeon(s) and Role:    * Cindee SaltKuzma, Jeyda Siebel, MD - Primary  PHYSICIAN ASSISTANT:   ASSISTANTS: none   ANESTHESIA:   general and IV sedation  EBL:  2ml BLOOD ADMINISTERED:none  DRAINS: none   LOCAL MEDICATIONS USED:  NONE  SPECIMEN:  No Specimen  DISPOSITION OF SPECIMEN:  N/A  COUNTS:  YES  TOURNIQUET:   Total Tourniquet Time Documented: Upper Arm (Right) - 32 minutes Total: Upper Arm (Right) - 32 minutes   DICTATION: .Other Dictation: Dictation Number [id number]  PLAN OF CARE: Discharge to home after PACU  PATIENT DISPOSITION:  PACU - hemodynamically stable.

## 2017-11-24 NOTE — Anesthesia Procedure Notes (Signed)
Anesthesia Regional Block: Supraclavicular block   Pre-Anesthetic Checklist: ,, timeout performed, Correct Patient, Correct Site, Correct Laterality, Correct Procedure, Correct Position, site marked, Risks and benefits discussed,  Surgical consent,  Pre-op evaluation,  At surgeon's request and post-op pain management  Laterality: Right  Prep: chloraprep       Needles:  Injection technique: Single-shot  Needle Type: Echogenic Needle     Needle Length: 9cm  Needle Gauge: 21     Additional Needles:   Procedures:,,,, ultrasound used (permanent image in chart),,,,  Narrative:  Start time: 11/24/2017 10:42 AM End time: 11/24/2017 10:47 AM Injection made incrementally with aspirations every 5 mL.  Performed by: Personally  Anesthesiologist: Cecile Hearingurk, Derrion Tritz Edward, MD  Additional Notes: No pain on injection. No increased resistance to injection. Injection made in 5cc increments.  Good needle visualization.  Patient tolerated procedure well.

## 2017-11-24 NOTE — Discharge Instructions (Addendum)

## 2017-11-25 ENCOUNTER — Encounter (HOSPITAL_BASED_OUTPATIENT_CLINIC_OR_DEPARTMENT_OTHER): Payer: Self-pay | Admitting: Orthopedic Surgery

## 2019-09-05 ENCOUNTER — Other Ambulatory Visit: Payer: Self-pay

## 2019-09-05 ENCOUNTER — Ambulatory Visit: Payer: BC Managed Care – PPO | Attending: Internal Medicine

## 2019-09-05 DIAGNOSIS — Z20822 Contact with and (suspected) exposure to covid-19: Secondary | ICD-10-CM | POA: Insufficient documentation

## 2019-09-06 LAB — NOVEL CORONAVIRUS, NAA: SARS-CoV-2, NAA: NOT DETECTED
